# Patient Record
Sex: Male | Born: 1990 | Race: White | Hispanic: No | Marital: Single | Smoking: Current every day smoker
Health system: Southern US, Community
[De-identification: ages and names within clinical notes are randomized; demographics above are authoritative.]

## PROBLEM LIST (undated history)

## (undated) DIAGNOSIS — F329 Major depressive disorder, single episode, unspecified: Secondary | ICD-10-CM

## (undated) DIAGNOSIS — F410 Panic disorder [episodic paroxysmal anxiety] without agoraphobia: Secondary | ICD-10-CM

## (undated) DIAGNOSIS — F419 Anxiety disorder, unspecified: Secondary | ICD-10-CM

## (undated) DIAGNOSIS — F32A Depression, unspecified: Secondary | ICD-10-CM

---
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
## 2012-01-14 ENCOUNTER — Ambulatory Visit: Payer: Self-pay | Admitting: Family Medicine

## 2012-01-14 ENCOUNTER — Ambulatory Visit: Payer: No Typology Code available for payment source

## 2012-01-14 VITALS — BP 150/84 | HR 83 | Temp 98.5°F | Resp 18

## 2012-01-14 DIAGNOSIS — J45909 Unspecified asthma, uncomplicated: Secondary | ICD-10-CM

## 2012-01-14 DIAGNOSIS — M79609 Pain in unspecified limb: Secondary | ICD-10-CM

## 2012-01-14 DIAGNOSIS — M79641 Pain in right hand: Secondary | ICD-10-CM

## 2012-01-14 MED ORDER — AZITHROMYCIN 250 MG PO TABS: ORAL_TABLET | ORAL | Status: AC

## 2012-01-14 MED ORDER — HYDROCODONE-ACETAMINOPHEN 5-500 MG PO CAPS: 1.0000 | ORAL_CAPSULE | Freq: Three times a day (TID) | ORAL | Status: AC | PRN
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
## 2012-01-15 ENCOUNTER — Telehealth: Payer: Self-pay

## 2012-01-15 ENCOUNTER — Encounter: Payer: Self-pay | Admitting: Family Medicine

## 2012-01-15 MED ORDER — OXYCODONE-ACETAMINOPHEN 5-325 MG PO TABS: 1.0000 | ORAL_TABLET | Freq: Four times a day (QID) | ORAL | Status: AC | PRN

## 2012-01-15 NOTE — Telephone Encounter (Signed)
Patient states that he is already taking 2 tabs Q 6 hours and it is not helping. I will authorize enough Percocet to get him through the night and defer further treatment to treating physician. Prescription is printed and up front.

## 2012-01-15 NOTE — Telephone Encounter (Signed)
Pt states he really needs his pain medicine and states this is his second call and really needs to speak with someone as soon as possible Please call pt at [phone]

## 2012-01-15 NOTE — Telephone Encounter (Signed)
Pt states that the hydrocodone that he was prescribed is causing him to not get any sleep, pt would like to know if he should increase the hydrocodone to two a day or should he take something else.

## 2012-01-15 NOTE — Telephone Encounter (Signed)
Pt notified that rx is ready for pick up

## 2012-01-15 NOTE — Telephone Encounter (Signed)
Pt states he is in a lot of pain and would like to know if he can 2 hydrocodone

## 2012-01-15 NOTE — Telephone Encounter (Signed)
Ok to increase to 2 tabs very 6 hours as needed.

## 2012-01-16 ENCOUNTER — Telehealth: Payer: Self-pay

## 2012-01-16 NOTE — Telephone Encounter (Signed)
PT IS NEEDING TO TALK WITH SOME ONE REGARDING HIS PAIN MEDS

## 2012-01-16 NOTE — Telephone Encounter (Signed)
Pt was seen at [facility] today.  They splinted his hand and told him to double up on meds. He stated he has not had any sleep for 2 days now and hopes now he can get something to eat.

## 2012-02-02 ENCOUNTER — Emergency Department (HOSPITAL_COMMUNITY)
Admission: EM | Admit: 2012-02-02 | Discharge: 2012-02-03 | Disposition: A | Discharge status: Home / Self Care | Payer: Worker's Compensation | Attending: Emergency Medicine | Admitting: Emergency Medicine

## 2012-02-02 ENCOUNTER — Encounter (HOSPITAL_COMMUNITY): Payer: Self-pay | Admitting: Family Medicine

## 2012-02-02 ENCOUNTER — Emergency Department (HOSPITAL_COMMUNITY): Payer: Worker's Compensation

## 2012-02-02 DIAGNOSIS — F17200 Nicotine dependence, unspecified, uncomplicated: Secondary | ICD-10-CM | POA: Insufficient documentation

## 2012-02-02 DIAGNOSIS — M79609 Pain in unspecified limb: Secondary | ICD-10-CM | POA: Insufficient documentation

## 2012-02-02 DIAGNOSIS — M79673 Pain in unspecified foot: Secondary | ICD-10-CM

## 2012-02-02 NOTE — ED Notes (Signed)
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 No obvious deformity noted. Drug not required.

## 2012-02-03 MED ORDER — OXYCODONE-ACETAMINOPHEN 5-325 MG PO TABS: 1.0000 | ORAL_TABLET | Freq: Four times a day (QID) | ORAL | Status: AC | PRN

## 2012-02-03 MED ORDER — NAPROXEN 500 MG PO TABS: 500.0000 mg | ORAL_TABLET | Freq: Two times a day (BID) | ORAL | Status: AC

## 2012-02-03 MED ORDER — OXYCODONE-ACETAMINOPHEN 5-325 MG PO TABS
2.0000 | ORAL_TABLET | Freq: Once | ORAL | Status: AC
  Administered 2012-02-03: 2 via ORAL
  Filled 2012-02-03: qty 2

## 2012-02-03 NOTE — Discharge Instructions (Signed)
Please read and follow all provided instructions.  Your diagnoses today include:  1. Foot pain     Tests performed today include:  An x-ray of your foot - does NOT show any broken bones  Vital signs. See below for your results today.   Medications prescribed:   Percocet (oxycodone/acetaminophen) - narcotic pain medication  If you have been prescribed narcotic pain medication such as Vicodin, Percocet or Tramadol: DO NOT drive or perform any activities that require you to be awake and alert because this medicine can make you drowsy. BE VERY CAREFUL not to take multiple medicines containing Tylenol (also called acetaminophen). Doing so can lead to an overdose which can damage your liver and cause liver failure and possibly death.    Naproxen - anti-inflammatory pain medication  Do not exceed 500mg  naproxen every 12 hours, take with food  You have been prescribed an anti-inflammatory medication or NSAID. Take with food. Take smallest effective dose for the shortest duration needed for your pain. Stop taking if you experience stomach pain or vomiting.   Take any prescribed medications only as directed.  Home care instructions:   Follow any educational materials contained in this packet  Follow R.I.C.E. Protocol:  R - rest your injury   I  - use ice on injury without applying directly to skin  C - compress injury with bandage or splint  E - elevate the injury as much as possible  Follow-up instructions: Please follow-up with your primary care provider or the provided orthopedic (bone specialist) if you continue to have significant pain or trouble walking in 1 week. In this case you may have a severe sprain that requires further care.   If you do not have a primary care doctor -- see below for referral information.   Return instructions:   Please return if your toes are numb or tingling, appear gray or blue, or you have severe pain (also elevate leg and loosen splint or  wrap)  Please return to the Emergency Department if you experience worsening symptoms.   Please return if you have any other emergent concerns.  Additional Information:  Your vital signs today were: BP 125/81  Pulse 104  Temp 98.4 F (36.9 C) (Oral)  Resp 20  SpO2 99% If your blood pressure (BP) was elevated above 135/85 this visit, please have this repeated by your doctor within one month. -------------- Your caregiver has diagnosed you as suffering from an foot sprain. Foot sprain occurs when the ligaments that hold the ankle joint together are stretched or torn. It may take 4 to 6 weeks to heal.  For Activity: If prescribed crutches, use crutches with non-weight bearing for the first few days. Then, you may walk on your ankle as the pain allows, or as instructed. Start gradually with weight bearing on the affected ankle. Once you can walk pain free, then try jogging. When you can run forwards, then you can try moving side-to-side. If you cannot walk without crutches in one week, you need a re-check. -------------- No Primary Care Doctor Call [facility]  [phone] Other agencies that provide inexpensive medical care    [facility]  [phone]    [facility]  [phone]    [facility]  [phone]    [facility]  [phone]    [facility]  [phone]    [facility]  [phone] -------------- RESOURCE GUIDE:  Dental Problems  Patients with Medicaid: [facility]  [facility] [address]                                            [phone] [address] [employer] [address] Phone:  [phone]                                                   Phone:  [phone]  If unable to pay or uninsured, contact:  [facility] or [facility]. to become qualified for the adult dental clinic.  Chronic Pain Problems Contact [facility]  [phone] Patients need to be referred by their  primary care doctor.  Insufficient Money for Medicine Contact [employer]:  call "211" or [facility] [phone].  Psychological Services [facility]  [phone] [facility]  [phone] [facility]   [phone] (emergency services [phone])  Substance Abuse Resources [facility]  [phone] [facility] [phone] [facility] [phone] Floydene Flock [phone] Residential & Outpatient Substance Abuse Program  [phone]  Abuse/Neglect [facility] Child Abuse Hotline [phone] [facility] Child Abuse Hotline 2056404809 (After Hours)  Emergency Shelter [facility] [phone]  Maternity Homes Room at the [place] [facility] [phone] [place] [facility] [phone]  [facility] Resources  [facility]     [employer]                          [place] Dept. [address] [place]                       [address]      [address]  Blondell Reveal Phone:  [phone]                                   Phone:  [phone]                 Phone:  [phone]  [facility] Phone:  [phone]  [facility] Child Abuse Hotline [phone] [phone] (After Hours)

## 2012-02-03 NOTE — ED Provider Notes (Signed)
Medical screening examination/treatment/procedure(s) were performed by non-physician practitioner and as supervising physician I was immediately available for consultation/collaboration.  Celene Kras, MD 02/03/12 [phone]

## 2012-02-03 NOTE — ED Provider Notes (Signed)
History     CSN: [id number]  Arrival date & time 02/02/12  2101   First MD Initiated Contact with Patient 02/02/12 2303      Chief Complaint  Patient presents with  . Foot Injury    (Consider location/radiation/quality/duration/timing/severity/associated sxs/prior treatment) HPI Comments: Conveyor belt onto R foot. PAtient works for [employer]. He was wearing steel toed boots.   Patient is a 21 y.o. male presenting with foot injury. The history is provided by the patient.  Foot Injury  The incident occurred 1 to 2 hours ago. The incident occurred at work. Injury mechanism: conveyor belt fell onto and pinned R foot. The pain is present in the right foot. The quality of the pain is described as sharp. The pain is moderate. The pain has been constant since onset. Associated symptoms include inability to bear weight. Pertinent negatives include no numbness, no loss of motion and no loss of sensation. He reports no foreign bodies present. The symptoms are aggravated by palpation and bearing weight. He has tried NSAIDs for the symptoms. The treatment provided no relief.    History reviewed. No pertinent past medical history.  History reviewed. No pertinent past surgical history.  No family history on file.  History  Substance Use Topics  . Smoking status: Current Everyday Smoker -- 1.0 packs/day for 4 years    Types: Cigarettes  . Smokeless tobacco: Never Used  . Alcohol Use: No      Review of Systems  Constitutional: Negative for activity change.  HENT: Negative for neck pain.   Musculoskeletal: Positive for arthralgias and gait problem. Negative for back pain and joint swelling.  Skin: Negative for wound.  Neurological: Negative for weakness and numbness.    Allergies  Review of patient's allergies indicates no known allergies.  Home Medications   Current Outpatient Rx  Name Route Sig Dispense Refill  . ALBUTEROL 90 MCG/ACT IN AERS Inhalation Inhale 2 puffs into the lungs  every 6 (six) hours as needed for wheezing. 17 g 4  . HYDROCODONE-ACETAMINOPHEN 5-325 MG PO TABS Oral Take 1 tablet by mouth every 6 (six) hours as needed. Pain      BP 125/81  Pulse 104  Temp 98.4 F (36.9 C) (Oral)  Resp 20  SpO2 99%  Physical Exam  Nursing note and vitals reviewed. Constitutional: He appears well-developed and well-nourished.  HENT:  Head: Normocephalic and atraumatic.  Eyes: Conjunctivae are normal.  Neck: Normal range of motion. Neck supple.  Cardiovascular: Normal pulses.   Musculoskeletal: He exhibits tenderness. He exhibits no edema.       Right ankle: Normal. He exhibits normal range of motion. no tenderness. No head of 5th metatarsal and no proximal fibula tenderness found. Achilles tendon normal.       Right lower leg: Normal. He exhibits no tenderness.       Right foot: He exhibits tenderness and bony tenderness. He exhibits normal range of motion, no swelling, normal capillary refill and no deformity.       Feet:  Neurological: He is alert. No sensory deficit.       Motor, sensation, and vascular distal to the injury is fully intact.   Skin: Skin is warm and dry.  Psychiatric: He has a normal mood and affect.    ED Course  Procedures (including critical care time)  Labs Reviewed - No data to display Dg Foot Complete Right  02/03/2012  *RADIOLOGY REPORT*  Clinical Data: Heavy object fell on dorsal midfoot.  Foot pain  across metatarsals.  RIGHT FOOT COMPLETE - 3+ VIEW  Comparison:  None.  Findings:  There is no evidence of fracture or dislocation.  There is no evidence of arthropathy or other focal bone abnormality. Soft tissues are unremarkable.  IMPRESSION: Negative.  Original Report Authenticated By: Danae Orleans, M.D.     1. Foot pain     12:05 AM Patient seen and examined. X-ray reviewed by myself. Pain medicine ordered.   Vital signs reviewed and are as follows: Filed Vitals:   02/02/12 2139  BP: 125/81  Pulse: 104  Temp: 98.4 F  (36.9 C)  Resp: 20   Crutches, hard sole sandal, and ACE by ortho tech. Spoke with radiologist directly -- x-ray neg. Pt informed.   Counseled on RICE protocol. Patient to follow-up with ortho if no improvement in one week.   Patient counseled on use of narcotic pain medications. Counseled not to combine these medications with others containing tylenol. Urged not to drink alcohol, drive, or perform any other activities that requires focus while taking these medications. The patient verbalizes understanding and agrees with the plan.    MDM  Foot injury, x-ray neg, ortho f/u if not improving, RICE until then.         Renne Crigler, [place] 02/03/12 1529

## 2012-02-05 ENCOUNTER — Emergency Department (HOSPITAL_COMMUNITY)
Admission: EM | Admit: 2012-02-05 | Discharge: 2012-02-06 | Disposition: A | Discharge status: Home / Self Care | Payer: Worker's Compensation | Attending: Emergency Medicine | Admitting: Emergency Medicine

## 2012-02-05 ENCOUNTER — Encounter (HOSPITAL_COMMUNITY): Payer: Self-pay | Admitting: Emergency Medicine

## 2012-02-05 DIAGNOSIS — M79609 Pain in unspecified limb: Secondary | ICD-10-CM | POA: Insufficient documentation

## 2012-02-05 DIAGNOSIS — M79673 Pain in unspecified foot: Secondary | ICD-10-CM

## 2012-02-05 NOTE — ED Notes (Signed)
Pt alert, nad, c/o right foot pain, seen in ED a few days ago for injury, states "i ran out of pain medications", resp even unlabored, skin pwd

## 2012-02-06 MED ORDER — OXYCODONE-ACETAMINOPHEN 5-325 MG PO TABS
1.0000 | ORAL_TABLET | Freq: Once | ORAL | Status: DC
  Filled 2012-02-06: qty 1

## 2012-02-06 MED ORDER — TRAMADOL HCL 50 MG PO TABS: 50.0000 mg | ORAL_TABLET | Freq: Four times a day (QID) | ORAL | Status: AC | PRN

## 2012-02-06 NOTE — ED Provider Notes (Signed)
History     CSN: [id number]  Arrival date & time 02/05/12  2239   HPI Pt complaining of foot pain. States heavy machine fell on his foot several weeks ago. States he does not have a broken foot and states it feels as if something is pressing his foot. States he has run out of his medication. The pain is now a 10 out of 10.  Patient is a 21 y.o. male presenting with lower extremity pain. The history is provided by the patient.  Foot Pain This is a new problem. The current episode started 1 to 4 weeks ago. The problem occurs constantly. The problem has been gradually worsening. Pertinent negatives include no joint swelling, numbness or weakness. The symptoms are aggravated by walking and standing.    History reviewed. No pertinent past medical history.  History reviewed. No pertinent past surgical history.  No family history on file.  History  Substance Use Topics  . Smoking status: Current Everyday Smoker -- 1.0 packs/day for 4 years    Types: Cigarettes  . Smokeless tobacco: Never Used  . Alcohol Use: No      Review of Systems  Musculoskeletal: Negative for joint swelling.       Foot pain  Neurological: Negative for weakness and numbness.  All other systems reviewed and are negative.    Allergies  Review of patient's allergies indicates no known allergies.  Home Medications   Current Outpatient Rx  Name Route Sig Dispense Refill  . ALBUTEROL 90 MCG/ACT IN AERS Inhalation Inhale 2 puffs into the lungs every 6 (six) hours as needed for wheezing. 17 g 4  . NAPROXEN 500 MG PO TABS Oral Take 1 tablet (500 mg total) by mouth 2 (two) times daily. 20 tablet 0  . OXYCODONE-ACETAMINOPHEN 5-325 MG PO TABS Oral Take 1-2 tablets by mouth every 6 (six) hours as needed for pain. 10 tablet 0  . TRAMADOL HCL 50 MG PO TABS Oral Take 1 tablet (50 mg total) by mouth every 6 (six) hours as needed for pain. 30 tablet 0    BP 152/83  Pulse 97  Temp 98 F (36.7 C) (Oral)  Resp 16  SpO2  97%  Physical Exam  Constitutional: He is oriented to person, place, and time. He appears well-developed and well-nourished.  HENT:  Head: Normocephalic and atraumatic.  Eyes: Pupils are equal, round, and reactive to light.  Musculoskeletal:       Right foot: He exhibits decreased range of motion and tenderness. He exhibits no bony tenderness, no swelling, normal capillary refill, no crepitus, no deformity and no laceration.       Feet:  Neurological: He is alert and oriented to person, place, and time.  Skin: Skin is warm and dry. No rash noted. No erythema. No pallor.  Psychiatric: He has a normal mood and affect. His behavior is normal.    ED Course  Procedures   MDM   Patient speaks to me with a very high pitched voice and covers his eyes as if crying. Seems to be over exaggerating his pain with no obvious findings on exam. Patient screams in the slightest touch to foot. I have checked patient on the [place] controlled substance database. Since 01/14/2012 patient does have 100 tablets of oral narcotics filled. I discussed this with patient advised that I would not be giving him any medication besides tramadol. Patient only speaks in a normal voice and does not appear to be in any pain. Highly suspect  patient has drug-seeking behavior. Would advise no additional narcotic pain control in the future unless obvious acute injury.       Thomasene Lot, PA-C 02/06/12 [phone]

## 2012-02-06 NOTE — ED Provider Notes (Signed)
Medical screening examination/treatment/procedure(s) were performed by non-physician practitioner and as supervising physician I was immediately available for consultation/collaboration.   Dayton Bailiff, MD 02/06/12 [phone]

## 2012-02-06 NOTE — Discharge Instructions (Signed)
Please followup with your doctor for further pain control if needed. Begin taking ibuprofen and Tylenol for pain.

## 2012-02-06 NOTE — ED Notes (Signed)
Pt verbalizes understanding 

## 2012-03-03 ENCOUNTER — Ambulatory Visit (INDEPENDENT_AMBULATORY_CARE_PROVIDER_SITE_OTHER): Payer: No Typology Code available for payment source | Admitting: Pediatrics

## 2012-03-03 ENCOUNTER — Encounter: Payer: Self-pay | Admitting: Pediatrics

## 2012-03-03 VITALS — Wt 155.4 lb

## 2012-03-03 DIAGNOSIS — F17200 Nicotine dependence, unspecified, uncomplicated: Secondary | ICD-10-CM | POA: Insufficient documentation

## 2012-03-03 DIAGNOSIS — F411 Generalized anxiety disorder: Secondary | ICD-10-CM

## 2012-03-03 DIAGNOSIS — J029 Acute pharyngitis, unspecified: Secondary | ICD-10-CM

## 2012-03-03 DIAGNOSIS — F419 Anxiety disorder, unspecified: Secondary | ICD-10-CM | POA: Insufficient documentation

## 2012-03-03 LAB — POCT MONO (EPSTEIN BARR VIRUS): Mono, POC: NEGATIVE

## 2012-03-03 NOTE — Patient Instructions (Signed)
Benedryl/mylanta 50:50  Mix. gargle 1 tsp every hr Call for strep results Call adolescent clinic [phone]
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
## 2012-03-04 LAB — STREP A DNA PROBE: GASP: NEGATIVE

## 2012-03-06 ENCOUNTER — Encounter: Payer: Self-pay | Admitting: Pediatrics

## 2012-03-06 ENCOUNTER — Ambulatory Visit (INDEPENDENT_AMBULATORY_CARE_PROVIDER_SITE_OTHER): Payer: No Typology Code available for payment source | Admitting: Pediatrics

## 2012-03-06 VITALS — Wt 153.8 lb

## 2012-03-06 DIAGNOSIS — F419 Anxiety disorder, unspecified: Secondary | ICD-10-CM

## 2012-03-06 DIAGNOSIS — F411 Generalized anxiety disorder: Secondary | ICD-10-CM

## 2012-03-06 DIAGNOSIS — Z20828 Contact with and (suspected) exposure to other viral communicable diseases: Secondary | ICD-10-CM

## 2012-03-06 MED ORDER — ALPRAZOLAM ER 1 MG PO TB24: ORAL_TABLET | ORAL | Status: DC

## 2012-03-06 MED ORDER — SERTRALINE HCL 25 MG PO TABS: ORAL_TABLET | ORAL | Status: DC

## 2012-03-06 MED ORDER — PREDNISONE 20 MG PO TABS: 60.0000 mg | ORAL_TABLET | Freq: Every day | ORAL | Status: AC

## 2012-03-06 NOTE — Progress Notes (Signed)
Still tired with huge tonsils Spoke with Adolescent Doc Merla Riches) ok to put on ssri -choose what works in family and very limited xanax until seen Strep and mono - here and GASP-  PE still 3-4+ TONSILS WITH EXUDATE, still nodes Showed to ID Dr Ardyth Man who agrees with EBV  ASS ebv pharyngitis Plan discussed with mother- no RX steroids for pain relief 60 qd x 5, discussed ssris she got HA on all, last was celexa started on sertraline 25 qd x 1 week can increase if needed to 50. Xanax  1 mg tab xr use if needed for anxiety x 1/day 5 tabs only. Has appt at adolescent clinic this week 7/25 315pm

## 2012-03-09 ENCOUNTER — Ambulatory Visit (INDEPENDENT_AMBULATORY_CARE_PROVIDER_SITE_OTHER): Payer: No Typology Code available for payment source | Admitting: Internal Medicine

## 2012-03-09 ENCOUNTER — Encounter: Payer: Self-pay | Admitting: Internal Medicine

## 2012-03-09 VITALS — BP 135/75

## 2012-03-09 DIAGNOSIS — F411 Generalized anxiety disorder: Secondary | ICD-10-CM

## 2012-03-09 MED ORDER — SERTRALINE HCL 50 MG PO TABS: 50.0000 mg | ORAL_TABLET | Freq: Every day | ORAL | Status: DC

## 2012-03-09 MED ORDER — CLONAZEPAM 0.5 MG PO TABS: 0.5000 mg | ORAL_TABLET | Freq: Two times a day (BID) | ORAL | Status: DC
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
## 2012-03-10 ENCOUNTER — Telehealth: Payer: Self-pay

## 2012-03-10 NOTE — Telephone Encounter (Signed)
PT STATES HE WAS PUT ON 0.5MG S OF KLONOPIN TO TAKE AS NEEDED AND HE TOOK A FEW MORE BECAUSE HE FELT HIS ATTACKS WERE COMING ON, WOULD LIKE TO HAVE THE .1MG S TO TAKE 3 TIMES A DAY INSTEAD OF TWICE A DAY. WAS GIVEN 60 BUT NEED 90 PLEASE CALL [phone]

## 2012-03-10 NOTE — Telephone Encounter (Signed)
Has 90 klonopin .5mg  May take 2 tabs every 8 hrs for the next 14 days and he will have enough medication to last that long. He should call back [phone] adolescent med clinic next Thursday and leave a message with Shawna Orleans or the nurse Amy Daphine Deutscher about how this dose is working and we will call him back at that point.

## 2012-03-10 NOTE — Telephone Encounter (Signed)
Patient has called a third time! I told him to continue taking the medication as directed and if a change needs to be made, Dr Merla Riches can call him to change that.  [phone]

## 2012-03-10 NOTE — Telephone Encounter (Signed)
This patient has called again stating he wants his klonopin prescription changed to 1mg  3x/day. Has called twice within the last !  Best [phone]  [id number] [place], [place] - [address]

## 2012-03-12 ENCOUNTER — Telehealth: Payer: Self-pay | Admitting: Radiology

## 2012-03-12 NOTE — Telephone Encounter (Signed)
Spoke to patient. Gave him Dr. Netta Corrigan message. He said that the pharmacy only gave him #60 Klonopin because it was written for twice a day. He was not given #90. Told him to call in when he is in need for the rest.

## 2012-03-12 NOTE — Telephone Encounter (Signed)
LMOM TO CB 

## 2012-03-16 ENCOUNTER — Other Ambulatory Visit: Payer: Self-pay | Admitting: Internal Medicine

## 2012-03-16 MED ORDER — CLONAZEPAM 1 MG PO TABS: 1.0000 mg | ORAL_TABLET | Freq: Three times a day (TID) | ORAL | Status: DC | PRN

## 2012-03-16 NOTE — Addendum Note (Signed)
Addended by: Tonye Pearson on: 03/16/2012 03:26 PM   Modules accepted: Orders

## 2012-03-17 ENCOUNTER — Ambulatory Visit (INDEPENDENT_AMBULATORY_CARE_PROVIDER_SITE_OTHER): Payer: No Typology Code available for payment source | Admitting: Pediatrics

## 2012-03-17 VITALS — BP 118/64 | Ht 73.5 in | Wt 153.8 lb

## 2012-03-17 DIAGNOSIS — Z23 Encounter for immunization: Secondary | ICD-10-CM

## 2012-03-17 DIAGNOSIS — F329 Major depressive disorder, single episode, unspecified: Secondary | ICD-10-CM

## 2012-03-17 DIAGNOSIS — Z0000 Encounter for general adult medical examination without abnormal findings: Secondary | ICD-10-CM

## 2012-03-17 NOTE — Progress Notes (Signed)
21 yo  Graduated 2012 [employer], working [employer], having anxiety- referred to adolescent- started on sertraline/clonipin for anxiety- have not yet tackled smoking though decreased FEELS BETTER Wcm= 8oz, fav food=steak, urine x 6-8, stools x 2, smokes but decreased  PE alert, NAD HEENT clear CVS rr, no M,pulses +/+ Lungs clear Abd soft, no HSM, male T5, L hydrocele Neuro intact, good tone,strength,cranial and DTRs Back low lordosis and small lumbar scoliosis  ASS well adolescent, smoker, anxiety and depression Discuss meds,depression,anxiety,work,school, diet,sex,smoking,adolescent clinic and vaccines-HPV 2 given

## 2012-03-19 ENCOUNTER — Encounter: Payer: Self-pay | Admitting: Pediatrics

## 2012-03-19 DIAGNOSIS — F329 Major depressive disorder, single episode, unspecified: Secondary | ICD-10-CM | POA: Insufficient documentation

## 2012-03-24 ENCOUNTER — Telehealth: Payer: Self-pay

## 2012-03-24 NOTE — Telephone Encounter (Signed)
Wants call on Saturday/

## 2012-03-24 NOTE — Telephone Encounter (Signed)
Jamie Brady is concerned about meds Zoloft & Clonopin combination that her 21 year old son Jamie Brady is taking. Please call Saturday if possible, not today. [phone]
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
## 2012-03-25 NOTE — Telephone Encounter (Signed)
Spoke with patient and discussed below.  Patient denies that meds are changing his mood, and sounds "with it" on the phone.  He said that he had been up all night the night prior, which was why he appeared that way to his mother.  But will contact us in the future if meds do become of concern.

## 2012-03-25 NOTE — Telephone Encounter (Signed)
Needs to RTC

## 2012-04-06 ENCOUNTER — Encounter: Payer: Self-pay | Admitting: Internal Medicine

## 2012-04-06 ENCOUNTER — Ambulatory Visit (INDEPENDENT_AMBULATORY_CARE_PROVIDER_SITE_OTHER): Payer: No Typology Code available for payment source | Admitting: Internal Medicine

## 2012-04-06 ENCOUNTER — Telehealth: Payer: Self-pay | Admitting: Radiology

## 2012-04-06 VITALS — BP 120/60 | Ht 75.0 in | Wt 150.0 lb

## 2012-04-06 DIAGNOSIS — F411 Generalized anxiety disorder: Secondary | ICD-10-CM

## 2012-04-06 MED ORDER — CLONAZEPAM 1 MG PO TABS: 1.0000 mg | ORAL_TABLET | Freq: Three times a day (TID) | ORAL | Status: DC | PRN

## 2012-04-06 MED ORDER — SERTRALINE HCL 100 MG PO TABS: 100.0000 mg | ORAL_TABLET | Freq: Every day | ORAL | Status: DC

## 2012-04-06 NOTE — Progress Notes (Signed)
One month followup Patient Active Problem List  Diagnosis     . Smoking addiction  . Anxiety  . Depression   Has done well with with initial medications In particular Clonopin has stabilized his anxiety. He has been able to handle several situations at work recently which would have been trouble in the past. He requires all 3 doses each day although he may break doses into halves at times He continues to live with friends but visits his mother daily or every other day to perform chores around the house He continues to avoid his older brother His older brother has a twin who is disabled from birth following a stroke with kidney failure/cognitively intact and pursuing a career in Firefighter His girlfriend remains a constant fixture in his life He is even smoking less/recognizes that he was smoking to treat his anxiety/now delays his first cigarette for 20 or 30 minutes after waking Recognizes the need to quit/girlfriend also smokes He no longer has difficulty with sleep  Has no depressive thoughts or suicidal ideation over the last 3 weeks No further panic attacks  Problem #1 generalized anxiety disorder Increase Zoloft 200 mg daily Continue Klonopin 1 mg #90 one 3 times a day as needed We discussed the need to decrease Klonopin as Zoloft continues to improve in his ability to control his anxiety We discussed both dependence and tolerance  Problem #2 depression-stable  Problem #3 nicotine addiction He will continue a gradual reduction at this point and find a plan that includes his girlfriend's smoking  2 return in 2 months   Meds ordered this encounter  Medications  . sertraline (ZOLOFT) 100 MG tablet    Sig: Take 1 tablet (100 mg total) by mouth daily.    Dispense:  90 tablet    Refill:  0  . clonazePAM (KLONOPIN) 1 MG tablet    Sig: Take 1 tablet (1 mg total) by mouth 3 (three) times daily as needed for anxiety. Ok to fill today    Dispense:  90 tablet   Refill:  2

## 2012-04-06 NOTE — Telephone Encounter (Signed)
Thanks for the note/I talked to the pharmacist today At this point I'll try to get him to adhere strictly to the schedule for medication

## 2012-04-06 NOTE — Telephone Encounter (Signed)
July 25th pt was given Klonopin 0.5 mg #60 one bid then Aug 1 Klonopin 1mg  #90 tid/ pharmacy is concerned this is 8 days early, and patient should have enough supply from these even with dosage change/ pharmacy [phone] [facility] [address] I told the pharmacist your note indicates okay for today, but since he should have enough to last because he has had 2 RX for this medication recently.

## 2012-04-19 ENCOUNTER — Encounter (HOSPITAL_COMMUNITY): Payer: Self-pay | Admitting: *Deleted

## 2012-04-19 ENCOUNTER — Emergency Department (HOSPITAL_COMMUNITY)
Admission: EM | Admit: 2012-04-19 | Discharge: 2012-04-19 | Disposition: A | Discharge status: Home / Self Care | Payer: No Typology Code available for payment source | Attending: Emergency Medicine | Admitting: Emergency Medicine

## 2012-04-19 DIAGNOSIS — F411 Generalized anxiety disorder: Secondary | ICD-10-CM | POA: Insufficient documentation

## 2012-04-19 DIAGNOSIS — F1010 Alcohol abuse, uncomplicated: Secondary | ICD-10-CM | POA: Insufficient documentation

## 2012-04-19 DIAGNOSIS — F17200 Nicotine dependence, unspecified, uncomplicated: Secondary | ICD-10-CM | POA: Insufficient documentation

## 2012-04-19 HISTORY — DX: Depression, unspecified: F32.A

## 2012-04-19 HISTORY — DX: Anxiety disorder, unspecified: F41.9

## 2012-04-19 HISTORY — DX: Major depressive disorder, single episode, unspecified: F32.9

## 2012-04-19 HISTORY — DX: Panic disorder (episodic paroxysmal anxiety): F41.0
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
## 2012-04-21 NOTE — ED Provider Notes (Signed)
Medical screening examination/treatment/procedure(s) were performed by non-physician practitioner and as supervising physician I was immediately available for consultation/collaboration.  Illana Nolting, MD 04/21/12 0602 

## 2012-04-25 ENCOUNTER — Telehealth: Payer: Self-pay | Admitting: Pediatrics

## 2012-04-25 NOTE — Telephone Encounter (Signed)
Late entry from 04/23/2012 @ 4:45: Spoke with mom about her concerns about Jamie Brady with the medications he prescribed and alcohol use, I explained to mom now that he is 21 yrs old he will have to reach out himself for help with his substance abuse issues. She has tried to contact Dr. Merla Riches regarding her concerns about the meds he prescribed.   I gave her the name of some physicality's he can call for help and suggested that she and the family get some consoling.  I also told mom he needed to find a family practice doctor because he has aged out of our practice.

## 2012-04-26 ENCOUNTER — Telehealth: Payer: Self-pay

## 2012-04-26 NOTE — Telephone Encounter (Signed)
When I called the adolescent medicine clinic they indicated patients mother has called there multiple times as well. I have called her to let her know I am working on work in Optometrist for [employer] and will call him as soon as I know something. She is angry because no one returned her calls from 3 weeks ago when she had called with concerns about patients behavior on the medications. I advised her we spoke to [place] directly about this and therefore did not call her back. She requests I tell you she thinks her son needs to go for inpatient rehab due to his alcohol use, I told her I will get you the message.

## 2012-04-26 NOTE — Telephone Encounter (Signed)
Pt states that he has stopped taking his medication zoloft and clonazepam, pt thinks that he may possibly be bi-polar. Pt would like to discuss these issues with someone.  [phone]
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
## 2012-04-27 ENCOUNTER — Encounter: Payer: Self-pay | Admitting: Internal Medicine

## 2012-04-27 ENCOUNTER — Ambulatory Visit (INDEPENDENT_AMBULATORY_CARE_PROVIDER_SITE_OTHER): Payer: No Typology Code available for payment source | Admitting: Internal Medicine

## 2012-04-27 VITALS — BP 132/75 | HR 69 | Ht 75.0 in | Wt 143.6 lb

## 2012-04-27 DIAGNOSIS — F329 Major depressive disorder, single episode, unspecified: Secondary | ICD-10-CM

## 2012-04-27 DIAGNOSIS — F1010 Alcohol abuse, uncomplicated: Secondary | ICD-10-CM

## 2012-04-27 DIAGNOSIS — F419 Anxiety disorder, unspecified: Secondary | ICD-10-CM

## 2012-04-27 DIAGNOSIS — G4700 Insomnia, unspecified: Secondary | ICD-10-CM

## 2012-04-27 DIAGNOSIS — F411 Generalized anxiety disorder: Secondary | ICD-10-CM

## 2012-04-27 MED ORDER — PAROXETINE HCL 20 MG PO TABS: 20.0000 mg | ORAL_TABLET | Freq: Every day | ORAL | Status: DC

## 2012-04-27 MED ORDER — TRAZODONE HCL 50 MG PO TABS: 50.0000 mg | ORAL_TABLET | Freq: Every day | ORAL | Status: DC
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
## 2012-04-27 NOTE — Progress Notes (Signed)
Referred pt to [facility] per Dr. Merla Riches.  Their intake coordinator will call pt to set up appt.  Per pt his best contact # is [phone]- this was the contact number given for pt.

## 2012-05-02 ENCOUNTER — Telehealth: Payer: Self-pay | Admitting: *Deleted

## 2012-05-02 NOTE — Telephone Encounter (Signed)
Message copied by Mora Bellman on Tue May 02, 2012  4:59 PM ------      Message from: Lizbeth Bark      Created: Tue May 02, 2012  9:56 AM      Regarding: phone message      Contact: [phone]       Pt is having issues sleeping, he thinks its because of his new medication.

## 2012-05-02 NOTE — Telephone Encounter (Signed)
Pt states he has not been able to sleep well even with taking trazadone.  States he sleeps a max of 5 hours, and some nights not at all.   Feels that depression is improving on paxil, but still feels anxious.  Advised I would ask Dr. Merla Riches about meds and call him back.

## 2012-05-03 MED ORDER — QUETIAPINE FUMARATE 25 MG PO TABS: 25.0000 mg | ORAL_TABLET | Freq: Every day | ORAL | Status: DC

## 2012-05-03 NOTE — Telephone Encounter (Signed)
This would be 25 milligrams instead of 30 mg

## 2012-05-03 NOTE — Telephone Encounter (Addendum)
Called in seroquel as directed below.  Called pt to let him know- both of his phone numbers were out of service.  He has appt with Dr. Merla Riches tomorrow, and he will talk to him about the medication at that time.

## 2012-05-03 NOTE — Telephone Encounter (Signed)
It would be okay for him to try Seroquel 30 mg at bedtime instead of trazodone Dispense #14 no refills

## 2012-05-04 ENCOUNTER — Ambulatory Visit (INDEPENDENT_AMBULATORY_CARE_PROVIDER_SITE_OTHER): Payer: No Typology Code available for payment source | Admitting: Internal Medicine

## 2012-05-04 ENCOUNTER — Encounter: Payer: Self-pay | Admitting: Internal Medicine

## 2012-05-04 VITALS — BP 134/60 | Ht 75.0 in | Wt 145.0 lb

## 2012-05-04 DIAGNOSIS — F329 Major depressive disorder, single episode, unspecified: Secondary | ICD-10-CM

## 2012-05-04 DIAGNOSIS — F411 Generalized anxiety disorder: Secondary | ICD-10-CM

## 2012-05-04 DIAGNOSIS — F32A Depression, unspecified: Secondary | ICD-10-CM

## 2012-05-04 DIAGNOSIS — F419 Anxiety disorder, unspecified: Secondary | ICD-10-CM

## 2012-05-04 MED ORDER — QUETIAPINE FUMARATE 25 MG PO TABS: 25.0000 mg | ORAL_TABLET | Freq: Every day | ORAL | Status: DC

## 2012-05-04 MED ORDER — CLONAZEPAM 1 MG PO TABS: 1.0000 mg | ORAL_TABLET | Freq: Three times a day (TID) | ORAL | Status: DC | PRN

## 2012-05-04 NOTE — Patient Instructions (Addendum)
Your appointment with [facility] is scheduled for 05/05/12 at 3:30pm  Their address is [address] - [place] next to [facility]                             [place], [place] [address]                             Phone number is [phone]

## 2012-05-04 NOTE — Progress Notes (Signed)
F/u   Patient Active Problem List  Diagnosis     . Smoking addiction  . Anxiety  . Depression  Depression symptoms responding well to Paxil and to avoiding confrontations with Mom Living w/ GF/friends Has part time job at [employer] Also should be reinstated at [employer] under union rules Anxiety continues with constant sweaty palms/racing heart--has d/ced all etoh since last discussion Seroquel 25mg  afforded good sleep after trazadone failed  Mood much brighter today with good future outlook and no despair appt at [facility] of care pending Bad news--about to age out of health choice  Plan Cont Paxil 20mg -has 2 ref Contin seroquel 25mg  hs#30 2 ref Add Klonopin 1.0 mg # 90 to take .5mg  bid no ref--no etoh Re 2 mos--sooner if worse Counseling[facility]/Tomorrow first visit

## 2012-05-24 ENCOUNTER — Other Ambulatory Visit: Payer: Self-pay | Admitting: Internal Medicine

## 2012-05-24 MED ORDER — ALPRAZOLAM 1 MG PO TABS: 1.0000 mg | ORAL_TABLET | Freq: Three times a day (TID) | ORAL | Status: DC | PRN

## 2012-05-24 MED ORDER — QUETIAPINE FUMARATE 25 MG PO TABS: 25.0000 mg | ORAL_TABLET | Freq: Every day | ORAL | Status: DC

## 2012-05-24 NOTE — Progress Notes (Signed)
Meds sent per Dr. Merla Riches.

## 2012-06-06 ENCOUNTER — Telehealth: Payer: Self-pay | Admitting: *Deleted

## 2012-06-06 NOTE — Telephone Encounter (Signed)
Message copied by Mora Bellman on Tue Jun 06, 2012  5:09 PM ------      Message from: Tonye Pearson      Created: Tue Jun 06, 2012  4:42 PM      Regarding: FW: phone message      Contact: [phone]       Call for me Jamie Brady to come- we can allow 1 no charge visit for him      ----- Message -----         From: Lizbeth Bark         Sent: 06/06/2012   3:24 PM           To: Tonye Pearson, MD      Subject: phone message                                            Please call patient regarding an appt, he is scheduled to come in on Thursday but states you told him it would be a no charge appt?  He was just verifying that.  He states he has no insurance,or job. He just needs a refill on his meds. He asked if you would please call him. Thanks!

## 2012-06-06 NOTE — Telephone Encounter (Signed)
Called and gave message per Dr. Merla Riches.

## 2012-06-08 ENCOUNTER — Ambulatory Visit (INDEPENDENT_AMBULATORY_CARE_PROVIDER_SITE_OTHER): Payer: Self-pay | Admitting: Internal Medicine

## 2012-06-08 ENCOUNTER — Encounter: Payer: Self-pay | Admitting: Internal Medicine

## 2012-06-08 VITALS — BP 135/90 | HR 109 | Ht 75.0 in | Wt 146.0 lb

## 2012-06-08 DIAGNOSIS — F411 Generalized anxiety disorder: Secondary | ICD-10-CM

## 2012-06-08 MED ORDER — ALPRAZOLAM 1 MG PO TABS: 1.0000 mg | ORAL_TABLET | Freq: Three times a day (TID) | ORAL | Status: DC | PRN

## 2012-06-08 MED ORDER — CLONAZEPAM 1 MG PO TABS: 1.0000 mg | ORAL_TABLET | Freq: Two times a day (BID) | ORAL | Status: DC | PRN

## 2012-06-08 NOTE — Progress Notes (Signed)
  Subjective:    Patient ID: Jamie Brady, male    DOB: July 06, 1991, 21 y.o.   MRN: [id number]  CC: 21 yo W M here for f/u  HPI Pt does not currently have a job, but is looking.  It will be hard for him to get medical care until he finds work.  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  He describes his current medications below: -Xanax 1mg  tid/ not depressed all day and able to go out and look for work. Anx stable as well. -klonopin half am and 1 hs - helps with the xanax -Seroquel helped him to sleep at night, but that is it.  It is expensive.  He has stopped taking it and has been able to sleep with his nightly dose of xanax 1.0 plus Klonopin 1.0 No further alcohol abuse or other drug use This is the best he's felt in many months. He has improved his relationship with his mother and occasionally sleeps at home now but still tries to limit his interactions as she remains  critical and unwilling to be helpful. Spends most of his time with his girlfriend and her mother We discussed [employer] program that allows you to go to college as a possible option.  He has a girlfriend whos pastor has connections with [employer] and he might go to her church to see how the pastor might be able to help him.   Review of Systems Unnecessary    Objective:   Physical Exam Noncontributory       Assessment & Plan:  Problem #1 generalized anxiety disorder -Social work looking for facilities that will work with him Despite no money or no insurance. [employer] of care was too expensive -Refill Xanax 1 mg tid x4mths and Klonopin 1mg  bid x10mths  Followup 3 months or sooner if worse

## 2012-06-14 ENCOUNTER — Telehealth: Payer: Self-pay

## 2012-06-14 NOTE — Telephone Encounter (Signed)
Called pt, he is asking if he can take his Klonopin 3 times a day. He states a lot is "going on" he states he has been arguing with his mother, and his brother is also having some difficult times currently.

## 2012-06-14 NOTE — Telephone Encounter (Signed)
Left pt a VM to return my call  

## 2012-06-14 NOTE — Telephone Encounter (Signed)
Could barely understand patient, he seemed a little out of it but I think he was hoping to change dosage of Klonopin to match Xanax and take 3 of each daily. He had "another episode recently" and feels he need an increase in dosage.  Best [phone]

## 2012-06-14 NOTE — Telephone Encounter (Signed)
Spoke with pt- gave him the message from Dr. Merla Riches.  Pt agreeable.

## 2012-06-14 NOTE — Telephone Encounter (Signed)
This would make him run out early Should look more other ways to avoid the things making him be anxious

## 2012-06-15 ENCOUNTER — Ambulatory Visit: Payer: Self-pay | Admitting: Internal Medicine

## 2012-06-29 ENCOUNTER — Ambulatory Visit: Payer: No Typology Code available for payment source | Admitting: Internal Medicine

## 2012-07-03 ENCOUNTER — Telehealth: Payer: Self-pay | Admitting: Pediatrics

## 2012-07-03 NOTE — Telephone Encounter (Signed)
Spoke with pt- advised him his rxs had 2 refills on them.  Pt agreeable to call [facility] to get refills.

## 2012-07-03 NOTE — Telephone Encounter (Signed)
Patient called and is wanting to know if he can come by and get his prescriptions today instead of Thursday.  He is supposed to be starting a new job on Thursday and will not be able to come by.  He needs Xanax and Klonopin.

## 2012-07-20 ENCOUNTER — Other Ambulatory Visit: Payer: Self-pay | Admitting: *Deleted

## 2012-07-20 ENCOUNTER — Telehealth: Payer: Self-pay | Admitting: *Deleted

## 2012-07-20 MED ORDER — PAROXETINE HCL 40 MG PO TABS: 40.0000 mg | ORAL_TABLET | Freq: Every day | ORAL | Status: DC

## 2012-07-20 NOTE — Telephone Encounter (Addendum)
Spoke with pt - he states he is not sleeping well.  Advised him Dr. Merla Riches increased his paxil- he states he is not taking paxil. He states he will pick up refill of seroquel at pharmacy as this will help him sleep.  Dr. Merla Riches is agreeable. Asked him to call back to schedule appt with dr Merla Riches for next week- no charge.  He said he would.  Also offered the info about the [facility]- but he said he could not take it then, he was at work.

## 2012-07-20 NOTE — Telephone Encounter (Signed)
1. Per Dr. Jerline Pain rx for increase on pt's paxil to 40 mg qd.   Klonopin and xanax should last him till time for refill.   2. Can make self referral to [facility]  [phone]  3.  Dr. Merla Riches will see him for free visit next Wednesday.  Called pt back - left him message to call back

## 2012-07-20 NOTE — Telephone Encounter (Signed)
Message copied by Mora Bellman on Thu Jul 20, 2012  3:25 PM ------      Message from: CERESI, MELANIE L      Created: Thu Jul 20, 2012  8:45 AM      Regarding: phone message      Contact: [phone]       Pt would like both his meds dosage increased. He states he is having panic attacks the past couple days and is having anxiety. Pharmacy is [facility] outpatient.

## 2012-07-24 ENCOUNTER — Telehealth: Payer: Self-pay | Admitting: *Deleted

## 2012-07-24 NOTE — Telephone Encounter (Signed)
Message copied by Mora Bellman on Mon Jul 24, 2012 10:55 AM ------      Message from: Claiborne Billings      Created: Mon Jul 24, 2012 10:37 AM      Regarding: FYI Only       Called to say that he is doing well.  Has found a place to live.  Has enough refills to get through December.  Previously had thought meds were incorrect dose but was just under a lot of stress - thinks everything is ok now.  Will contact us after the first regarding refills (or follow-up appt.)

## 2012-08-17 ENCOUNTER — Ambulatory Visit (INDEPENDENT_AMBULATORY_CARE_PROVIDER_SITE_OTHER): Payer: Self-pay | Admitting: Internal Medicine

## 2012-08-17 VITALS — BP 140/60

## 2012-08-17 DIAGNOSIS — F411 Generalized anxiety disorder: Secondary | ICD-10-CM

## 2012-08-17 DIAGNOSIS — F3289 Other specified depressive episodes: Secondary | ICD-10-CM

## 2012-08-17 DIAGNOSIS — F329 Major depressive disorder, single episode, unspecified: Secondary | ICD-10-CM

## 2012-08-17 DIAGNOSIS — F419 Anxiety disorder, unspecified: Secondary | ICD-10-CM

## 2012-08-17 DIAGNOSIS — F17200 Nicotine dependence, unspecified, uncomplicated: Secondary | ICD-10-CM

## 2012-08-17 MED ORDER — QUETIAPINE FUMARATE 25 MG PO TABS: 25.0000 mg | ORAL_TABLET | Freq: Every day | ORAL | Status: DC

## 2012-08-17 MED ORDER — ALPRAZOLAM 2 MG PO TABS
2.0000 mg | ORAL_TABLET | Freq: Three times a day (TID) | ORAL | Status: DC | PRN
Start: 2012-08-17 — End: 2012-11-13
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
## 2012-08-18 ENCOUNTER — Encounter: Payer: Self-pay | Admitting: Internal Medicine

## 2012-08-24 ENCOUNTER — Ambulatory Visit: Payer: Self-pay | Admitting: Internal Medicine

## 2012-10-16 ENCOUNTER — Telehealth: Payer: Self-pay | Admitting: *Deleted

## 2012-10-16 NOTE — Telephone Encounter (Signed)
Pharmacy and patient notified.
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
## 2012-11-01 ENCOUNTER — Telehealth: Payer: Self-pay | Admitting: *Deleted

## 2012-11-01 NOTE — Telephone Encounter (Signed)
Spoke with pt- he said he is doing great, the best that he can remember.  Only issue is paying for healthcare.  Advised him to check into Obamacare per Dr. Merla Riches.  He agreed.

## 2012-11-01 NOTE — Telephone Encounter (Signed)
Message copied by Daphine Deutscher, Corrina Steffensen C on Wed Nov 01, 2012  4:00 PM ------      Message from: Tonye Pearson      Created: Wed Nov 01, 2012  9:48 AM      Regarding: RE: refill request      Contact: [phone]       He got rx 1/2 or 1/3 with 3 refills so should be getting another on 4/3      Can let us know in late April how he's doing//encourage him to sign up for Obamacare before 3/28--should be cheap for him      ----- Message -----         From: Mora Bellman, RN         Sent: 10/30/2012   3:18 PM           To: Tonye Pearson, MD      Subject: FW: refill request                                       Pt says he has plenty of xanax right now, but would like to know if he needs a follow up appt with Dr. Merla Riches before he gets his next refill?      ----- Message -----         From: Lizbeth Bark         Sent: 10/30/2012   2:51 PM           To: Mora Bellman, RN      Subject: refill request                                           Pt is requesting a refill on xanax. Pt would rather not come in for an appt due to it being self pay. [facility]             ------

## 2012-11-13 ENCOUNTER — Other Ambulatory Visit: Payer: Self-pay | Admitting: Internal Medicine

## 2012-11-13 DIAGNOSIS — F411 Generalized anxiety disorder: Secondary | ICD-10-CM

## 2012-11-15 NOTE — Telephone Encounter (Signed)
Ok to continue treatment Meds ordered this encounter  Medications  . alprazolam (XANAX) 2 MG tablet    Sig: TAKE 1 TABLET BY MOUTH THREE TIMES DAILY AS NEEDED    Dispense:  90 tablet    Refill:  3

## 2012-12-18 ENCOUNTER — Telehealth: Payer: Self-pay | Admitting: *Deleted

## 2012-12-18 NOTE — Telephone Encounter (Signed)
Message copied by Mora Bellman on Mon Dec 18, 2012 11:05 AM ------      Message from: CERESI, Shawna Orleans L      Created: Mon Dec 18, 2012 10:30 AM      Regarding: phone message      Contact: [phone]       Pt states his xanax bottle fell out of pocket and he didn't find it until later on and the bottle and pills crushed.  Requesting a refill. He hasn't taken a pill since 5 am yesterday. ------
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
## 2012-12-21 ENCOUNTER — Other Ambulatory Visit: Payer: Self-pay | Admitting: *Deleted

## 2012-12-21 MED ORDER — CLONIDINE HCL 0.2 MG PO TABS: 0.2000 mg | ORAL_TABLET | Freq: Two times a day (BID) | ORAL | Status: DC

## 2012-12-21 MED ORDER — HYDROXYZINE HCL 25 MG PO TABS: 25.0000 mg | ORAL_TABLET | Freq: Three times a day (TID) | ORAL | Status: DC | PRN

## 2012-12-21 NOTE — Progress Notes (Signed)
Left VM for pt to return my call re: prescriptions.

## 2012-12-25 NOTE — Progress Notes (Signed)
Patient ID: Jamie Brady, male   DOB: 24-Oct-1990, 22 y.o.   MRN: [id number] Spoke with pt- advised him of rx's called into [facility] pharmacy for him to help with anxiety or nausea from withdrawl.  He states he does not know if he will pick them up.  Advised they are they if he needs them.

## 2013-02-26 ENCOUNTER — Telehealth: Payer: Self-pay | Admitting: *Deleted

## 2013-02-26 NOTE — Telephone Encounter (Deleted)
Lets see him sometime in July or August-I won't charge him   Can double book him and I ll have the resident start with the other patient   ----- Message -----   From: Mora Bellman, RN   Sent: 02/19/2013 12:00 PM   To: Tonye Pearson, MD      Pt called to schedule a f/u - states he lost his job during the month he was without xanax d/t panic attacks. He is doing much better since able to get xanax refilled. 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 He has no more refills on meds- he scheduled a f/u for 03/08/13, but wants to know if it could be a no charge visit as he still has no insurance.

## 2013-02-26 NOTE — Telephone Encounter (Signed)
Encounter opened in error

## 2013-03-08 ENCOUNTER — Encounter: Payer: Self-pay | Admitting: Internal Medicine

## 2013-03-08 ENCOUNTER — Ambulatory Visit (INDEPENDENT_AMBULATORY_CARE_PROVIDER_SITE_OTHER): Payer: Self-pay | Admitting: Internal Medicine

## 2013-03-08 VITALS — BP 152/84 | HR 97 | Ht 75.0 in | Wt 159.0 lb

## 2013-03-08 DIAGNOSIS — F419 Anxiety disorder, unspecified: Secondary | ICD-10-CM

## 2013-03-08 DIAGNOSIS — F411 Generalized anxiety disorder: Secondary | ICD-10-CM

## 2013-03-08 DIAGNOSIS — F329 Major depressive disorder, single episode, unspecified: Secondary | ICD-10-CM

## 2013-03-08 MED ORDER — ALPRAZOLAM 2 MG PO TABS: ORAL_TABLET | ORAL | Status: DC

## 2013-03-08 MED ORDER — QUETIAPINE FUMARATE 25 MG PO TABS: 25.0000 mg | ORAL_TABLET | Freq: Every day | ORAL | Status: DC

## 2013-03-08 MED ORDER — QUETIAPINE FUMARATE 25 MG PO TABS: 25.0000 mg | ORAL_TABLET | Freq: Every day | ORAL | Status: AC

## 2013-03-09 NOTE — Progress Notes (Signed)
F/u Patient Active Problem List   Diagnosis Date Noted  . Anxiety 03/03/2012    Priority: High  . Depression 03/19/2012  . 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 Lots of support from that side of the family although he still has no contact with his mother and no support from her Anxiety responds pretty well to medication although he would desire better control Current outpatient prescriptions:alprazolam (XANAX) 2 MG tablet, TAKE 1 TABLET BY MOUTH THREE TIMES DAILY AS NEEDED, Disp: 90 tablet, Rfl: 5;  QUEtiapine (SEROQUEL) 25 MG tablet, Take 1 tablet (25 mg total) by mouth at bedtime., Disp: 30 tablet, Rfl: 2 Uses Seroquel sparingly and it works No active depression no substance abuse Still has no Advice worker is disabled  Plan Continue same medication/discussed government supported insurance/made aware of health serve replacement Followup 6 months Meds ordered this encounter  Medications  . DISCONTD: alprazolam (XANAX) 2 MG tablet    Sig: TAKE 1 TABLET BY MOUTH THREE TIMES DAILY AS NEEDED    Dispense:  90 tablet    Refill:  5  . DISCONTD: QUEtiapine (SEROQUEL) 25 MG tablet    Sig: Take 1 tablet (25 mg total) by mouth at bedtime.    Dispense:  30 tablet    Refill:  2  . alprazolam (XANAX) 2 MG tablet    Sig: TAKE 1 TABLET BY MOUTH THREE TIMES DAILY AS NEEDED    Dispense:  90 tablet    Refill:  5  . QUEtiapine (SEROQUEL) 25 MG tablet    Sig: Take 1 tablet (25 mg total) by mouth at bedtime.    Dispense:  30 tablet    Refill:  2

## 2013-08-06 ENCOUNTER — Telehealth: Payer: Self-pay | Admitting: *Deleted

## 2013-08-06 DIAGNOSIS — F411 Generalized anxiety disorder: Secondary | ICD-10-CM

## 2013-08-06 NOTE — Telephone Encounter (Signed)
Would be fine to make sure he gets through til f/u  Can call in whenever

## 2013-08-06 NOTE — Telephone Encounter (Signed)
Pt called stating he is doing well, started a new job at [employer] 2 months ago, and has been promoted to a Production designer, theatre/television/film position.  He picked up a refill on his xanax 08/03/13, this was his last refill.  He scheduled an appt for f/u 09/06/13 with Dr. Merla Riches, but asked for a weeks worth of med - so he will have enough medicine to make it until his appointment.  He states he is afraid to run out of the medicine as his job is going so well.

## 2013-08-07 MED ORDER — ALPRAZOLAM 2 MG PO TABS: ORAL_TABLET | ORAL | Status: DC

## 2013-08-07 NOTE — Telephone Encounter (Signed)
Called in #21 for a week's worth of med to last until f/u.

## 2013-09-06 ENCOUNTER — Ambulatory Visit (INDEPENDENT_AMBULATORY_CARE_PROVIDER_SITE_OTHER): Payer: Self-pay | Admitting: Internal Medicine

## 2013-09-06 ENCOUNTER — Encounter: Payer: Self-pay | Admitting: Internal Medicine

## 2013-09-06 VITALS — BP 154/79 | HR 106 | Ht 76.0 in | Wt 173.0 lb

## 2013-09-06 DIAGNOSIS — F17200 Nicotine dependence, unspecified, uncomplicated: Secondary | ICD-10-CM

## 2013-09-06 DIAGNOSIS — F411 Generalized anxiety disorder: Secondary | ICD-10-CM

## 2013-09-06 DIAGNOSIS — F419 Anxiety disorder, unspecified: Secondary | ICD-10-CM

## 2013-09-06 MED ORDER — ALPRAZOLAM 2 MG PO TABS: ORAL_TABLET | ORAL | Status: DC

## 2013-09-06 MED ORDER — ALPRAZOLAM 2 MG PO TABS
ORAL_TABLET | ORAL | Status: AC
Start: 2013-09-06 — End: ?

## 2013-09-06 NOTE — Progress Notes (Signed)
GAD (generalized anxiety disorder)  Doing very well-new relat --stable,open,honest-follows 5 yr relat that ended for good reasons Still no relat w/ Mom Work going well-now managing rest-60hr weeks-doing well anx well contr at X 2tid. Able to breathe thru panic when confronted by difficult customers-no episodes of uncontr anger  Smoking addiction  Down to 1/2ppd with use of nic device  Sleeping well-no longer needs seroquel HS  Plan Meds ordered this encounter  Medications  . alprazolam (XANAX) 2 MG tablet    Sig: TAKE 1 TABLET BY MOUTH THREE TIMES DAILY AS NEEDED    Dispense:  90 tablet    Refill:  5  F/u 6mos Discussed decr dose slightly as more stable

## 2013-11-11 ENCOUNTER — Inpatient Hospital Stay (HOSPITAL_COMMUNITY)
Admission: EM | Admit: 2013-11-11 | Discharge: 2013-11-14 | DRG: 963 | Disposition: E | Payer: No Typology Code available for payment source | Attending: Surgery | Admitting: Surgery

## 2013-11-11 ENCOUNTER — Emergency Department (HOSPITAL_COMMUNITY): Payer: No Typology Code available for payment source

## 2013-11-11 DIAGNOSIS — S02109A Fracture of base of skull, unspecified side, initial encounter for closed fracture: Principal | ICD-10-CM | POA: Diagnosis present

## 2013-11-11 DIAGNOSIS — G9382 Brain death: Secondary | ICD-10-CM | POA: Diagnosis present

## 2013-11-11 DIAGNOSIS — S065X9A Traumatic subdural hemorrhage with loss of consciousness of unspecified duration, initial encounter: Secondary | ICD-10-CM

## 2013-11-11 DIAGNOSIS — Z66 Do not resuscitate: Secondary | ICD-10-CM | POA: Diagnosis present

## 2013-11-11 DIAGNOSIS — S72143A Displaced intertrochanteric fracture of unspecified femur, initial encounter for closed fracture: Secondary | ICD-10-CM | POA: Diagnosis present

## 2013-11-11 DIAGNOSIS — S065XAA Traumatic subdural hemorrhage with loss of consciousness status unknown, initial encounter: Secondary | ICD-10-CM | POA: Diagnosis present

## 2013-11-11 DIAGNOSIS — J9600 Acute respiratory failure, unspecified whether with hypoxia or hypercapnia: Secondary | ICD-10-CM | POA: Diagnosis present

## 2013-11-11 DIAGNOSIS — F1010 Alcohol abuse, uncomplicated: Secondary | ICD-10-CM | POA: Diagnosis present

## 2013-11-11 DIAGNOSIS — I959 Hypotension, unspecified: Secondary | ICD-10-CM | POA: Diagnosis present

## 2013-11-11 DIAGNOSIS — S43006A Unspecified dislocation of unspecified shoulder joint, initial encounter: Secondary | ICD-10-CM | POA: Diagnosis present

## 2013-11-11 DIAGNOSIS — S82209A Unspecified fracture of shaft of unspecified tibia, initial encounter for closed fracture: Secondary | ICD-10-CM | POA: Diagnosis present

## 2013-11-11 DIAGNOSIS — S06309A Unspecified focal traumatic brain injury with loss of consciousness of unspecified duration, initial encounter: Principal | ICD-10-CM

## 2013-11-11 DIAGNOSIS — IMO0002 Reserved for concepts with insufficient information to code with codable children: Secondary | ICD-10-CM

## 2013-11-11 DIAGNOSIS — G936 Cerebral edema: Secondary | ICD-10-CM | POA: Diagnosis present

## 2013-11-11 DIAGNOSIS — S270XXA Traumatic pneumothorax, initial encounter: Secondary | ICD-10-CM | POA: Diagnosis present

## 2013-11-11 DIAGNOSIS — G935 Compression of brain: Secondary | ICD-10-CM | POA: Diagnosis present

## 2013-11-11 LAB — I-STAT CHEM 8, ED
BUN: 5 mg/dL — AB (ref 6–23)
CALCIUM ION: 1.04 mmol/L — AB (ref 1.12–1.23)
CHLORIDE: 101 meq/L (ref 96–112)
CREATININE: 1.5 mg/dL — AB (ref 0.50–1.35)
GLUCOSE: 310 mg/dL — AB (ref 70–99)
HCT: 51 % (ref 39.0–52.0)
Hemoglobin: 17.3 g/dL — ABNORMAL HIGH (ref 13.0–17.0)
Potassium: 3.1 mEq/L — ABNORMAL LOW (ref 3.7–5.3)
Sodium: 142 mEq/L (ref 137–147)
TCO2: 19 mmol/L (ref 0–100)

## 2013-11-11 LAB — CBC
HCT: 46.1 % (ref 39.0–52.0)
HEMOGLOBIN: 16.9 g/dL (ref 13.0–17.0)
MCH: 33.9 pg (ref 26.0–34.0)
MCHC: 36.7 g/dL — AB (ref 30.0–36.0)
MCV: 92.6 fL (ref 78.0–100.0)
Platelets: 349 10*3/uL (ref 150–400)
RBC: 4.98 MIL/uL (ref 4.22–5.81)
RDW: 11.8 % (ref 11.5–15.5)
WBC: 25.7 10*3/uL — ABNORMAL HIGH (ref 4.0–10.5)

## 2013-11-11 LAB — PROTIME-INR
INR: 1.1 (ref 0.00–1.49)
PROTHROMBIN TIME: 14 s (ref 11.6–15.2)

## 2013-11-11 LAB — I-STAT CG4 LACTIC ACID, ED: Lactic Acid, Venous: 8.99 mmol/L — ABNORMAL HIGH (ref 0.5–2.2)

## 2013-11-11 MED ORDER — IOHEXOL 300 MG/ML  SOLN: 100.0000 mL | Freq: Once | INTRAMUSCULAR | Status: AC | PRN
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
## 2013-11-11 NOTE — ED Notes (Signed)
Dr Radford PaxBeaton given a copy of lactic acid results 8.99
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
## 2013-11-12 ENCOUNTER — Inpatient Hospital Stay (HOSPITAL_COMMUNITY): Payer: No Typology Code available for payment source

## 2013-11-12 ENCOUNTER — Emergency Department (HOSPITAL_COMMUNITY): Payer: No Typology Code available for payment source

## 2013-11-12 DIAGNOSIS — S065XAA Traumatic subdural hemorrhage with loss of consciousness status unknown, initial encounter: Secondary | ICD-10-CM | POA: Diagnosis present

## 2013-11-12 DIAGNOSIS — J9600 Acute respiratory failure, unspecified whether with hypoxia or hypercapnia: Secondary | ICD-10-CM

## 2013-11-12 DIAGNOSIS — S065X9A Traumatic subdural hemorrhage with loss of consciousness of unspecified duration, initial encounter: Secondary | ICD-10-CM

## 2013-11-12 DIAGNOSIS — S7290XA Unspecified fracture of unspecified femur, initial encounter for closed fracture: Secondary | ICD-10-CM

## 2013-11-12 DIAGNOSIS — G936 Cerebral edema: Secondary | ICD-10-CM

## 2013-11-12 DIAGNOSIS — S020XXA Fracture of vault of skull, initial encounter for closed fracture: Secondary | ICD-10-CM

## 2013-11-12 DIAGNOSIS — S82209A Unspecified fracture of shaft of unspecified tibia, initial encounter for closed fracture: Secondary | ICD-10-CM

## 2013-11-12 DIAGNOSIS — S069X9A Unspecified intracranial injury with loss of consciousness of unspecified duration, initial encounter: Secondary | ICD-10-CM

## 2013-11-12 DIAGNOSIS — S43006A Unspecified dislocation of unspecified shoulder joint, initial encounter: Secondary | ICD-10-CM

## 2013-11-12 LAB — COMPREHENSIVE METABOLIC PANEL
ALT: 43 U/L (ref 0–53)
AST: 102 U/L — ABNORMAL HIGH (ref 0–37)
Albumin: 3.3 g/dL — ABNORMAL LOW (ref 3.5–5.2)
Alkaline Phosphatase: 160 U/L — ABNORMAL HIGH (ref 39–117)
BUN: 7 mg/dL (ref 6–23)
CALCIUM: 8 mg/dL — AB (ref 8.4–10.5)
CHLORIDE: 98 meq/L (ref 96–112)
CO2: 16 mEq/L — ABNORMAL LOW (ref 19–32)
CREATININE: 1.13 mg/dL (ref 0.50–1.35)
GFR calc non Af Amer: >90 mL/min (ref >90)
GLUCOSE: 311 mg/dL — AB (ref 70–99)
Potassium: 3.4 mEq/L — ABNORMAL LOW (ref 3.7–5.3)
Sodium: 141 mEq/L (ref 137–147)
Total Bilirubin: <0.2 mg/dL — ABNORMAL LOW (ref 0.3–1.2)
Total Protein: 6.8 g/dL (ref 6.0–8.3)

## 2013-11-12 LAB — TYPE AND SCREEN
ABO/RH(D): O POS
ANTIBODY SCREEN: NEGATIVE
Unit division: 0
Unit division: 0

## 2013-11-12 LAB — PREPARE FRESH FROZEN PLASMA
Unit division: 0
Unit division: 0

## 2013-11-12 LAB — CDS SEROLOGY

## 2013-11-12 LAB — ETHANOL: ALCOHOL ETHYL (B): 272 mg/dL — AB (ref 0–11)

## 2013-11-12 LAB — URINALYSIS, ROUTINE W REFLEX MICROSCOPIC
Bilirubin Urine: NEGATIVE
GLUCOSE, UA: 250 mg/dL — AB
KETONES UR: NEGATIVE mg/dL
Leukocytes, UA: NEGATIVE
Nitrite: NEGATIVE
PROTEIN: NEGATIVE mg/dL
Specific Gravity, Urine: 1.012 (ref 1.005–1.030)
Urobilinogen, UA: 0.2 mg/dL (ref 0.0–1.0)
pH: 5.5 (ref 5.0–8.0)

## 2013-11-12 LAB — MRSA PCR SCREENING: MRSA by PCR: NEGATIVE

## 2013-11-12 LAB — URINE MICROSCOPIC-ADD ON

## 2013-11-12 LAB — ABO/RH: ABO/RH(D): O POS

## 2013-11-12 MED ORDER — LACTATED RINGERS IV SOLN
INTRAVENOUS | Status: DC
  Administered 2013-11-11 (×2): via INTRAVENOUS

## 2013-11-12 MED ORDER — ROCURONIUM BROMIDE 50 MG/5ML IV SOLN
INTRAVENOUS | Status: DC | PRN
  Administered 2013-11-11: 100 mg via INTRAVENOUS

## 2013-11-12 MED ORDER — SODIUM CHLORIDE 0.9 % IV SOLN
INTRAVENOUS | Status: DC
  Administered 2013-11-12: 1000 mL via INTRAVENOUS

## 2013-11-12 MED ORDER — CHLORHEXIDINE GLUCONATE 0.12 % MT SOLN: 15.0000 mL | Freq: Two times a day (BID) | OROMUCOSAL | Status: DC

## 2013-11-12 MED ORDER — BIOTENE DRY MOUTH MT LIQD: 15.0000 mL | Freq: Four times a day (QID) | OROMUCOSAL | Status: DC

## 2013-11-12 MED ORDER — PANTOPRAZOLE SODIUM 40 MG PO TBEC: 40.0000 mg | DELAYED_RELEASE_TABLET | Freq: Every day | ORAL | Status: DC

## 2013-11-12 MED ORDER — ONDANSETRON HCL 4 MG/2ML IJ SOLN: 4.0000 mg | Freq: Four times a day (QID) | INTRAMUSCULAR | Status: DC | PRN

## 2013-11-12 MED ORDER — IOHEXOL 300 MG/ML  SOLN
100.0000 mL | Freq: Once | INTRAMUSCULAR | Status: AC | PRN
  Administered 2013-11-12: 100 mL via INTRAVENOUS

## 2013-11-12 MED ORDER — ONDANSETRON HCL 4 MG PO TABS: 4.0000 mg | ORAL_TABLET | Freq: Four times a day (QID) | ORAL | Status: DC | PRN

## 2013-11-12 MED ORDER — PANTOPRAZOLE SODIUM 40 MG IV SOLR
40.0000 mg | Freq: Every day | INTRAVENOUS | Status: DC
  Filled 2013-11-12: qty 40

## 2013-11-12 MED ORDER — ETOMIDATE 2 MG/ML IV SOLN
INTRAVENOUS | Status: DC | PRN
Start: 2013-11-11 — End: 2013-11-12
  Administered 2013-11-11: 20 mg via INTRAVENOUS

## 2013-11-12 MED ORDER — ONDANSETRON HCL 4 MG/2ML IJ SOLN
4.0000 mg | Freq: Once | INTRAMUSCULAR | Status: AC
  Administered 2013-11-11: 4 mg via INTRAVENOUS
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
## 2013-11-12 NOTE — ED Notes (Signed)
Dr Marcha SoldersBrtalik arrived
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
## 2013-11-12 NOTE — ED Notes (Signed)
Bair Hugger on for rectal temp of 94.3

## 2013-11-12 NOTE — ED Notes (Signed)
Pt's mother Lewis ShockDona Guercio [phone] [facility] Dr. Corliss Skainssuei spoke with pt's mother on phone.  Trooper Sharma Covertorman also notified of pt's prognosis per trauma MD.
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
## 2013-11-12 NOTE — ED Notes (Signed)
Dr Gwinda Passesuie present.
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
## 2013-11-12 NOTE — ED Notes (Signed)
[facility] called referral number [phone] [id number]

## 2013-11-12 NOTE — ED Notes (Signed)
Michael from [facility] called and will be coming to unit 3100

## 2013-11-12 NOTE — ED Notes (Signed)
Neuro called/ called back at 0040
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
## 2013-11-12 NOTE — Progress Notes (Signed)
Around 0310 noted significant change in pt coloring and oxygen saturations were in the 60% range with a heart rate in the 130's to 150's.  Pt mother was with physician in the conference room. I felt that she would want to be with her son at this time and brought her back to the room. By [id number] heart rate had dropped significantly and pt was loosing his p waves. Shortly after pt no longer had a radial pulse and hands/lips were cyanotic. 0320 pt no longer was showing electrical activity on the monitor. I was unable to hear heart sounds. This was verified by 2nd RN T Ranelle OysterLilley.

## 2013-11-12 NOTE — ED Notes (Signed)
Unable to obtain medical history, allergies or medications.
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
## 2013-11-14 DEATH — deceased

## 2013-12-14 DEATH — deceased
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
